# Patient Record
Sex: Male | Born: 1985 | Race: Black or African American | Hispanic: No | Marital: Married | Smoking: Former smoker
Health system: Southern US, Community
[De-identification: ages and names within clinical notes are randomized; demographics above are authoritative.]

## PROBLEM LIST (undated history)

## (undated) DIAGNOSIS — I10 Essential (primary) hypertension: Secondary | ICD-10-CM

---

## 2019-01-14 ENCOUNTER — Other Ambulatory Visit: Payer: Self-pay

## 2019-01-14 DIAGNOSIS — Z20822 Contact with and (suspected) exposure to covid-19: Secondary | ICD-10-CM

## 2019-01-16 LAB — NOVEL CORONAVIRUS, NAA: SARS-CoV-2, NAA: NOT DETECTED

## 2019-03-23 ENCOUNTER — Emergency Department (HOSPITAL_COMMUNITY)
Admission: EM | Admit: 2019-03-23 | Discharge: 2019-03-23 | Disposition: A | Discharge status: Home / Self Care | Payer: 59 | Attending: Emergency Medicine | Admitting: Emergency Medicine

## 2019-03-23 ENCOUNTER — Encounter (HOSPITAL_COMMUNITY): Payer: Self-pay

## 2019-03-23 ENCOUNTER — Other Ambulatory Visit: Payer: Self-pay

## 2019-03-23 ENCOUNTER — Emergency Department (HOSPITAL_COMMUNITY): Payer: 59

## 2019-03-23 DIAGNOSIS — Z87891 Personal history of nicotine dependence: Secondary | ICD-10-CM | POA: Diagnosis not present

## 2019-03-23 DIAGNOSIS — R059 Cough, unspecified: Secondary | ICD-10-CM

## 2019-03-23 DIAGNOSIS — J40 Bronchitis, not specified as acute or chronic: Secondary | ICD-10-CM | POA: Insufficient documentation

## 2019-03-23 DIAGNOSIS — R05 Cough: Secondary | ICD-10-CM

## 2019-03-23 MED ORDER — PREDNISONE 10 MG (21) PO TBPK: ORAL_TABLET | Freq: Every day | ORAL | 0 refills | Status: DC

## 2019-03-23 MED ORDER — ALBUTEROL SULFATE HFA 108 (90 BASE) MCG/ACT IN AERS
2.0000 | INHALATION_SPRAY | RESPIRATORY_TRACT | Status: DC
  Administered 2019-03-23: 13:00:00 2 via RESPIRATORY_TRACT
  Filled 2019-03-23: qty 6.7

## 2019-03-23 NOTE — ED Triage Notes (Signed)
Patient c/o a productive cough with green/yellow sputum x 2 weeks.

## 2019-03-23 NOTE — ED Provider Notes (Signed)
[facility] DEPT Provider Note   CSN: [id number] Arrival date & time: 03/23/19  1057     History   Chief Complaint Chief Complaint  Patient presents with  . Cough    HPI Luke Bush is a 33 y.o. male.     33 year old male with 2 weeks of nonproductive cough and wheezing.  Does have a history of similar symptoms in the past associate bronchitis.  Has not use any medications for this.  Does admit to marijuana use as well as occasional vaping.  Denies any severe dyspnea on exertion.  No sore throat or ear pain.  No vomiting or diarrhea.  Denies any Covid exposures.     History reviewed. No pertinent past medical history.  There are no active problems to display for this patient.   History reviewed. No pertinent surgical history.      Home Medications    Prior to Admission medications   Not on File    Family History Family History  Problem Relation Age of Onset  . Healthy Mother   . Healthy Father     Social History Social History   Tobacco Use  . Smoking status: Former Research scientist (life sciences)  . Smokeless tobacco: Never Used  Substance Use Topics  . Alcohol use: Yes  . Drug use: Yes    Types: Marijuana     Allergies   Patient has no known allergies.   Review of Systems Review of Systems  All other systems reviewed and are negative.    Physical Exam Updated Vital Signs BP (!) 140/101 (BP Location: Left Arm)   Pulse 92   Temp 98.8 F (37.1 C) (Oral)   Resp 16   Ht 1.854 m (6\' 1" )   Wt 89.8 kg   SpO2 97%   BMI 26.12 kg/m   Physical Exam Vitals signs and nursing note reviewed.  Constitutional:      General: He is not in acute distress.    Appearance: Normal appearance. He is well-developed. He is not toxic-appearing.  HENT:     Head: Normocephalic and atraumatic.  Eyes:     General: Lids are normal.     Conjunctiva/sclera: Conjunctivae normal.     Pupils: Pupils are equal, round, and reactive to light.  Neck:   Musculoskeletal: Normal range of motion and neck supple.     Thyroid: No thyroid mass.     Trachea: No tracheal deviation.  Cardiovascular:     Rate and Rhythm: Normal rate and regular rhythm.     Heart sounds: Normal heart sounds. No murmur. No gallop.   Pulmonary:     Effort: Pulmonary effort is normal. No respiratory distress.     Breath sounds: No stridor. Examination of the right-lower field reveals decreased breath sounds and wheezing. Examination of the left-lower field reveals decreased breath sounds and wheezing. Decreased breath sounds and wheezing present. No rhonchi or rales.  Abdominal:     General: Bowel sounds are normal. There is no distension.     Palpations: Abdomen is soft.     Tenderness: There is no abdominal tenderness. There is no rebound.  Musculoskeletal: Normal range of motion.        General: No tenderness.  Skin:    General: Skin is warm and dry.     Findings: No abrasion or rash.  Neurological:     Mental Status: He is alert and oriented to person, place, and time.     GCS: GCS eye subscore is 4. GCS  verbal subscore is 5. GCS motor subscore is 6.     Cranial Nerves: No cranial nerve deficit.     Sensory: No sensory deficit.  Psychiatric:        Speech: Speech normal.        Behavior: Behavior normal.      ED Treatments / Results  Labs (all labs ordered are listed, but only abnormal results are displayed) Labs Reviewed - No data to display  EKG None  Radiology No results found.  Procedures Procedures (including critical care time)  Medications Ordered in ED Medications - No data to display   Initial Impression / Assessment and Plan / ED Course  I have reviewed the triage vital signs and the nursing notes.  Pertinent labs & imaging results that were available during my care of the patient were reviewed by me and considered in my medical decision making (see chart for details).       No evidence of pneumonia on chest x-ray.  Patient  symptoms are similar to prior episodes of bronchitis.  Will prescribe prednisone as well as albuterol HFA  Final Clinical Impressions(s) / ED Diagnoses   Final diagnoses:  Cough    ED Discharge Orders    None       Lorre Nick, MD 03/23/19 1236

## 2020-01-27 ENCOUNTER — Ambulatory Visit: Admit: 2020-01-27 | Discharge status: Against Medical Advice | Payer: Self-pay

## 2020-01-27 ENCOUNTER — Other Ambulatory Visit: Payer: Self-pay

## 2020-01-27 ENCOUNTER — Ambulatory Visit
Admission: EM | Admit: 2020-01-27 | Discharge: 2020-01-27 | Disposition: A | Discharge status: Home / Self Care | Payer: 59 | Attending: Emergency Medicine | Admitting: Emergency Medicine

## 2020-01-27 DIAGNOSIS — Z20822 Contact with and (suspected) exposure to covid-19: Secondary | ICD-10-CM

## 2020-01-27 DIAGNOSIS — R030 Elevated blood-pressure reading, without diagnosis of hypertension: Secondary | ICD-10-CM | POA: Diagnosis not present

## 2020-01-27 DIAGNOSIS — R009 Unspecified abnormalities of heart beat: Secondary | ICD-10-CM

## 2020-01-27 DIAGNOSIS — J069 Acute upper respiratory infection, unspecified: Secondary | ICD-10-CM

## 2020-01-27 MED ORDER — CETIRIZINE HCL 10 MG PO TABS: 10.0000 mg | ORAL_TABLET | Freq: Every day | ORAL | 0 refills | Status: DC

## 2020-01-27 MED ORDER — FLUTICASONE PROPIONATE 50 MCG/ACT NA SUSP: 1.0000 | Freq: Every day | NASAL | 0 refills | Status: AC

## 2020-01-27 MED ORDER — BENZONATATE 100 MG PO CAPS: 100.0000 mg | ORAL_CAPSULE | Freq: Three times a day (TID) | ORAL | 0 refills | Status: AC

## 2020-01-27 NOTE — ED Provider Notes (Signed)
[facility]    CSN: [id number] Arrival date & time: 01/27/20  1724      History   Chief Complaint Chief Complaint  Patient presents with  . Otalgia  . Sore Throat    HPI Luke Bush is a 34 y.o. male  Presenting for btl otalgia, ST, emesis (x 4) for the last 5 days. Has had some loose stools as well.  Denies fever, cough, CP, SOB, nausea, abd pain, hematochezia, melena.  Endorses adequate oral intake.  History reviewed. No pertinent past medical history.  There are no problems to display for this patient.   History reviewed. No pertinent surgical history.     Home Medications    Prior to Admission medications   Medication Sig Start Date End Date Taking? Authorizing Provider  benzonatate (TESSALON) 100 MG capsule Take 1 capsule (100 mg total) by mouth every 8 (eight) hours. 01/27/20   Hall-Potvin, [place], PA-C  cetirizine (ZYRTEC ALLERGY) 10 MG tablet Take 1 tablet (10 mg total) by mouth daily. 01/27/20   Hall-Potvin, [place], PA-C  fluticasone (FLONASE) 50 MCG/ACT nasal spray Place 1 spray into both nostrils daily. 01/27/20   Hall-Potvin, [place], PA-C    Family History Family History  Problem Relation Age of Onset  . Healthy Mother   . Healthy Father     Social History Social History   Tobacco Use  . Smoking status: Former Games developer  . Smokeless tobacco: Never Used  Vaping Use  . Vaping Use: Some days  . Substances: Nicotine, Flavoring  Substance Use Topics  . Alcohol use: Yes  . Drug use: Yes    Types: Marijuana     Allergies   Patient has no known allergies.   Review of Systems As per HPI   Physical Exam Triage Vital Signs ED Triage Vitals  Enc Vitals Group     BP      Pulse      Resp      Temp      Temp src      SpO2      Weight      Height      Head Circumference      Peak Flow      Pain Score      Pain Loc      Pain Edu?      Excl. in GC?    No data found.  Updated Vital Signs BP (!) 154/109   Pulse 80    Temp 98.5 F (36.9 C)   Resp 19   SpO2 98%   Visual Acuity Right Eye Distance:   Left Eye Distance:   Bilateral Distance:    Right Eye Near:   Left Eye Near:    Bilateral Near:     Physical Exam Constitutional:      General: He is not in acute distress.    Appearance: He is not toxic-appearing or diaphoretic.  HENT:     Head: Normocephalic and atraumatic.     Right Ear: Tympanic membrane and ear canal normal.     Left Ear: Tympanic membrane and ear canal normal.     Mouth/Throat:     Mouth: Mucous membranes are moist.     Pharynx: Oropharynx is clear.  Eyes:     General: No scleral icterus.    Conjunctiva/sclera: Conjunctivae normal.     Pupils: Pupils are equal, round, and reactive to light.  Neck:     Comments: Trachea midline, negative JVD Cardiovascular:  Rate and Rhythm: Normal rate and regular rhythm.  Pulmonary:     Effort: Pulmonary effort is normal. No respiratory distress.     Breath sounds: No wheezing.  Musculoskeletal:     Cervical back: Neck supple. No tenderness.  Lymphadenopathy:     Cervical: No cervical adenopathy.  Skin:    Capillary Refill: Capillary refill takes less than 2 seconds.     Coloration: Skin is not jaundiced or pale.     Findings: No rash.  Neurological:     Mental Status: He is alert and oriented to person, place, and time.      UC Treatments / Results  Labs (all labs ordered are listed, but only abnormal results are displayed) Labs Reviewed  NOVEL CORONAVIRUS, NAA    EKG   Radiology No results found.  Procedures Procedures (including critical care time)  Medications Ordered in UC Medications - No data to display  Initial Impression / Assessment and Plan / UC Course  I have reviewed the triage vital signs and the nursing notes.  Pertinent labs & imaging results that were available during my care of the patient were reviewed by me and considered in my medical decision making (see chart for details).      Patient afebrile, nontoxic, with SpO2 98%.  Covid PCR pending.  Patient to quarantine until results are back.  We will treat supportively as outlined below.  Return precautions discussed, patient verbalized understanding and is agreeable to plan. Final Clinical Impressions(s) / UC Diagnoses   Final diagnoses:  Encounter for screening laboratory testing for COVID-19 virus  Elevated heart rate with elevated blood pressure without diagnosis of hypertension  Viral URI     Discharge Instructions     Your COVID test is pending - it is important to quarantine / isolate at home until your results are back. If you test positive and would like further evaluation for persistent or worsening symptoms, you may schedule an E-visit or virtual (video) visit throughout the [facility] app or website.  PLEASE NOTE: If you develop severe chest pain or shortness of breath please go to the ER or call 9-1-1 for further evaluation --> DO NOT schedule electronic or virtual visits for this. Please call our office for further guidance / recommendations as needed.  For information about the Covid vaccine, please visit [email or web address]    ED Prescriptions    Medication Sig Dispense Auth. Provider   cetirizine (ZYRTEC ALLERGY) 10 MG tablet Take 1 tablet (10 mg total) by mouth daily. 30 tablet Hall-Potvin, [place], PA-C   fluticasone (FLONASE) 50 MCG/ACT nasal spray Place 1 spray into both nostrils daily. 16 g Hall-Potvin, [place], PA-C   benzonatate (TESSALON) 100 MG capsule Take 1 capsule (100 mg total) by mouth every 8 (eight) hours. 21 capsule Hall-Potvin, [place], PA-C     PDMP not reviewed this encounter.   Hall-Potvin, [place], [place] 01/27/20 1831

## 2020-01-27 NOTE — ED Triage Notes (Signed)
Pt presents with complaints of bilateral ear pain, sore throat and emesis x 5 days. Reports he is concerned for sinus infection. Pt states he threw up 4 times and also had some diarrhea as well. Denies any other symptoms.

## 2020-01-27 NOTE — Discharge Instructions (Addendum)
Your COVID test is pending - it is important to quarantine / isolate at home until your results are back. °If you test positive and would like further evaluation for persistent or worsening symptoms, you may schedule an E-visit or virtual (video) visit throughout the [facility] My[facility] app or website. ° °PLEASE NOTE: If you develop severe chest pain or shortness of breath please go to the ER or call 9-1-1 for further evaluation --> DO NOT schedule electronic or virtual visits for this. °Please call our office for further guidance / recommendations as needed. ° °For information about the Covid vaccine, please visit [email or web address] °

## 2020-01-30 LAB — NOVEL CORONAVIRUS, NAA: SARS-CoV-2, NAA: NOT DETECTED

## 2020-06-27 ENCOUNTER — Encounter (HOSPITAL_COMMUNITY): Payer: Self-pay | Admitting: Emergency Medicine

## 2020-06-27 ENCOUNTER — Emergency Department (HOSPITAL_COMMUNITY): Payer: Medicaid - Out of State

## 2020-06-27 ENCOUNTER — Other Ambulatory Visit: Payer: Self-pay

## 2020-06-27 ENCOUNTER — Emergency Department (HOSPITAL_COMMUNITY)
Admission: EM | Admit: 2020-06-27 | Discharge: 2020-06-28 | Disposition: A | Discharge status: Home / Self Care | Payer: Medicaid - Out of State | Attending: Emergency Medicine | Admitting: Emergency Medicine

## 2020-06-27 DIAGNOSIS — Z87891 Personal history of nicotine dependence: Secondary | ICD-10-CM | POA: Insufficient documentation

## 2020-06-27 DIAGNOSIS — W010XXA Fall on same level from slipping, tripping and stumbling without subsequent striking against object, initial encounter: Secondary | ICD-10-CM | POA: Insufficient documentation

## 2020-06-27 DIAGNOSIS — S6991XA Unspecified injury of right wrist, hand and finger(s), initial encounter: Secondary | ICD-10-CM | POA: Diagnosis present

## 2020-06-27 DIAGNOSIS — S63501A Unspecified sprain of right wrist, initial encounter: Secondary | ICD-10-CM | POA: Diagnosis not present

## 2020-06-27 NOTE — ED Triage Notes (Signed)
Patient slipped and injured his right wrist this evening .

## 2020-06-28 NOTE — Progress Notes (Signed)
Orthopedic Tech Progress Note Patient Details:  Luke Bush 1986/02/24 [id number]  Ortho Devices Type of Ortho Device: Velcro wrist splint Ortho Device/Splint Location: rue Ortho Device/Splint Interventions: Ordered,Application,Adjustment   Post Interventions Patient Tolerated: Well Instructions Provided: Care of device,Adjustment of device   Trinna Post 06/28/2020, 1:14 AM

## 2020-06-28 NOTE — ED Provider Notes (Signed)
[facility] EMERGENCY DEPARTMENT Provider Note   CSN: [id number] Arrival date & time: 06/27/20  2123     History Chief Complaint  Patient presents with  . Wrist Injury    Luke Bush is a 35 y.o. male.  35 year old male presents to the emergency department for evaluation of right wrist pain.  Reports that he slipped and fell with his right hand outstretched to catch his fall.  Has progressively developed worsening pain in his right wrist.  Describes a sharp pain sensation as well as associated swelling.  This improved with icing prior to arrival.  He did take some Tylenol with minimal relief.  No history of prior wrist injury or fracture.  He is right-hand dominant.       History reviewed. No pertinent past medical history.  There are no problems to display for this patient.   History reviewed. No pertinent surgical history.     Family History  Problem Relation Age of Onset  . Healthy Mother   . Healthy Father     Social History   Tobacco Use  . Smoking status: Former Games developer  . Smokeless tobacco: Never Used  Vaping Use  . Vaping Use: Some days  . Substances: Nicotine, Flavoring  Substance Use Topics  . Alcohol use: Yes  . Drug use: Yes    Types: Marijuana    Home Medications Prior to Admission medications   Medication Sig Start Date End Date Taking? Authorizing Provider  benzonatate (TESSALON) 100 MG capsule Take 1 capsule (100 mg total) by mouth every 8 (eight) hours. 01/27/20   Hall-Potvin, [place], PA-C  cetirizine (ZYRTEC ALLERGY) 10 MG tablet Take 1 tablet (10 mg total) by mouth daily. 01/27/20   Hall-Potvin, [place], PA-C  fluticasone (FLONASE) 50 MCG/ACT nasal spray Place 1 spray into both nostrils daily. 01/27/20   Hall-Potvin, [place], PA-C    Allergies    Patient has no known allergies.  Review of Systems   Review of Systems  Ten systems reviewed and are negative for acute change, except as noted in the HPI.    Physical  Exam Updated Vital Signs BP (!) 153/96   Pulse 89   Temp 98 F (36.7 C)   Resp 20   SpO2 99%   Physical Exam Vitals and nursing note reviewed.  Constitutional:      General: He is not in acute distress.    Appearance: He is well-developed and well-nourished. He is not diaphoretic.     Comments: Nontoxic-appearing and in no acute distress  HENT:     Head: Normocephalic and atraumatic.  Eyes:     General: No scleral icterus.    Extraocular Movements: EOM normal.     Conjunctiva/sclera: Conjunctivae normal.  Cardiovascular:     Rate and Rhythm: Normal rate and regular rhythm.     Pulses: Normal pulses.     Comments: Distal radial pulse 2+ in the right upper extremity.  Capillary refill brisk in all digits of the right hand. Pulmonary:     Effort: Pulmonary effort is normal. No respiratory distress.     Comments: Respirations even and unlabored Musculoskeletal:        General: Normal range of motion.     Cervical back: Normal range of motion.     Comments: Mild tenderness along the ulnar aspect of the right hand as well as just proximal to the ulnar styloid.  There is no crepitus, deformity.  No palpable effusion.  Preserved grip strength in the right  hand with normal range of motion of all digits.  Skin:    General: Skin is warm and dry.     Coloration: Skin is not pale.     Findings: No erythema or rash.  Neurological:     Mental Status: He is alert and oriented to person, place, and time.     Coordination: Coordination normal.     Comments: Finger to thumb opposition intact in the R hand.  Psychiatric:        Mood and Affect: Mood and affect normal.        Behavior: Behavior normal.     ED Results / Procedures / Treatments   Labs (all labs ordered are listed, but only abnormal results are displayed) Labs Reviewed - No data to display  EKG None  Radiology DG Wrist Complete Right  Result Date: 06/27/2020 CLINICAL DATA:  Right wrist pain after injury. EXAM: RIGHT  WRIST - COMPLETE 3+ VIEW COMPARISON:  None. FINDINGS: There is no evidence of fracture or dislocation. Normal joint spaces and alignment. There is soft tissue edema about the ulnar aspect of the wrist. IMPRESSION: Soft tissue edema.  No fracture. Electronically Signed   By: Narda Rutherford M.D.   On: 06/27/2020 22:05    Procedures Procedures   Medications Ordered in ED Medications - No data to display  ED Course  I have reviewed the triage vital signs and the nursing notes.  Pertinent labs & imaging results that were available during my care of the patient were reviewed by me and considered in my medical decision making (see chart for details).    MDM Rules/Calculators/A&P                          Patient presents to the emergency department for evaluation of R wrist pain. Patient neurovascularly intact on exam. Imaging negative for fracture, dislocation, bony deformity. No erythema, heat to touch to the affected area; no concern for septic joint. Compartments in the affected extremity are soft. Plan for supportive management including RICE and NSAIDs; primary care follow up as needed. Return precautions discussed and provided. Patient discharged in stable condition with no unaddressed concerns.   Final Clinical Impression(s) / ED Diagnoses Final diagnoses:  Sprain of right wrist, initial encounter    Rx / DC Orders ED Discharge Orders    None       Antony Madura, PA-C 06/28/20 0026    Cheryll Cockayne, MD 06/28/20 [phone]

## 2020-06-28 NOTE — Discharge Instructions (Signed)
Your x-ray did not show evidence of fracture/broken bone.  We recommend bracing for stability as well as ibuprofen or Aleve for management of pain and inflammation.  Ice your wrist 3-4 times per day to limit swelling as well.  You may return for new or concerning symptoms.

## 2020-10-18 ENCOUNTER — Ambulatory Visit
Admission: EM | Admit: 2020-10-18 | Discharge: 2020-10-18 | Disposition: A | Discharge status: Home / Self Care | Payer: Commercial Managed Care - PPO | Attending: Family Medicine | Admitting: Family Medicine

## 2020-10-18 ENCOUNTER — Ambulatory Visit: Payer: Self-pay

## 2020-10-18 ENCOUNTER — Other Ambulatory Visit: Payer: Self-pay

## 2020-10-18 DIAGNOSIS — B349 Viral infection, unspecified: Secondary | ICD-10-CM

## 2020-10-18 DIAGNOSIS — Z1152 Encounter for screening for COVID-19: Secondary | ICD-10-CM

## 2020-10-18 MED ORDER — LEVOCETIRIZINE DIHYDROCHLORIDE 5 MG PO TABS: 5.0000 mg | ORAL_TABLET | Freq: Every evening | ORAL | 0 refills | Status: AC

## 2020-10-18 MED ORDER — PROMETHAZINE-DM 6.25-15 MG/5ML PO SYRP: 5.0000 mL | ORAL_SOLUTION | Freq: Four times a day (QID) | ORAL | 0 refills | Status: AC | PRN

## 2020-10-18 NOTE — Discharge Instructions (Addendum)
Your COVID 19 results should result within 3-4 days. Negative results are immediately resulted to Mychart. Positive results will receive a follow-up call from our clinic. If symptoms are present, I recommend home quarantine until results are known.  Alternate Tylenol and ibuprofen as needed for body aches and fever.  Symptom management per recommendations discussed today.  If any breathing difficulty or chest pain develops go immediately to the closest emergency department for evaluation.  

## 2020-10-18 NOTE — ED Triage Notes (Signed)
Pt presents with complaints of headache, body aches, sore throat, and fatigue x 3 days. Reports negative at home covid test. Concerned for flu and covid. Pt states over the last few days he has moments where he feels like his heart is beating fast.

## 2020-10-19 LAB — COVID-19, FLU A+B NAA
Influenza A, NAA: NOT DETECTED
Influenza B, NAA: NOT DETECTED
SARS-CoV-2, NAA: NOT DETECTED

## 2022-01-12 ENCOUNTER — Ambulatory Visit (INDEPENDENT_AMBULATORY_CARE_PROVIDER_SITE_OTHER): Payer: Self-pay

## 2022-01-12 ENCOUNTER — Ambulatory Visit
Admission: EM | Admit: 2022-01-12 | Discharge: 2022-01-12 | Disposition: A | Discharge status: Home / Self Care | Payer: Commercial Managed Care - PPO | Attending: Urgent Care | Admitting: Urgent Care

## 2022-01-12 DIAGNOSIS — M79641 Pain in right hand: Secondary | ICD-10-CM

## 2022-01-12 DIAGNOSIS — I10 Essential (primary) hypertension: Secondary | ICD-10-CM

## 2022-01-12 DIAGNOSIS — S66911A Strain of unspecified muscle, fascia and tendon at wrist and hand level, right hand, initial encounter: Secondary | ICD-10-CM

## 2022-01-12 DIAGNOSIS — R0789 Other chest pain: Secondary | ICD-10-CM

## 2022-01-12 MED ORDER — NAPROXEN 375 MG PO TABS: 375.0000 mg | ORAL_TABLET | Freq: Two times a day (BID) | ORAL | 0 refills | Status: AC

## 2022-01-12 MED ORDER — AMLODIPINE BESYLATE 5 MG PO TABS: 5.0000 mg | ORAL_TABLET | Freq: Every day | ORAL | 0 refills | Status: DC

## 2022-01-12 NOTE — ED Triage Notes (Signed)
Pt reports waking up overnight with "heart pain" going through to back.  Denies dizziness, SOB.    Triage completed by provider.

## 2022-01-12 NOTE — Discharge Instructions (Addendum)

## 2022-01-12 NOTE — ED Provider Notes (Signed)
[facility]  Note:  This document was prepared using Conservation officer, historic buildings and may include unintentional dictation errors.  MRN: [id number] DOB: 26-Jun-1985  Subjective:   Luke Bush is a 36 y.o. male presenting for s fall, history of gout, bruising, swelling, warmth, wounds.  Everal day history of persistent right-handed pain.  No trauma, patient does a lot of work at [employer] and has to use his hands and wrists consistently.  Has not use any medications for relief.  He is also concerned about his heart as he has been waking up overnight feeling like his heart is pounding through his chest and sometimes feels that toward his back.  No overt chest pain, shortness of breath, wheezing, palpitations, diaphoresis, left-sided neck pain, left-sided arm pain, left jaw pain.  Patient is a former smoker, quit smoking 2 months ago.  He is currently vaping occasionally and also smokes marijuana regularly.  Has a history of hypertension but states that he is not wanting to manage this with medication but has been using apple cider vinegar.  Does not have a PCP.  No history of heart disease.  No current facility-administered medications for this encounter.  Current Outpatient Medications:    benzonatate (TESSALON) 100 MG capsule, Take 1 capsule (100 mg total) by mouth every 8 (eight) hours., Disp: 21 capsule, Rfl: 0   fluticasone (FLONASE) 50 MCG/ACT nasal spray, Place 1 spray into both nostrils daily., Disp: 16 g, Rfl: 0   levocetirizine (XYZAL) 5 MG tablet, Take 1 tablet (5 mg total) by mouth every evening., Disp: 20 tablet, Rfl: 0   promethazine-dextromethorphan (PROMETHAZINE-DM) 6.25-15 MG/5ML syrup, Take 5 mLs by mouth 4 (four) times daily as needed for cough., Disp: 140 mL, Rfl: 0   No Known Allergies  No past medical history on file.   No past surgical history on file.  Family History  Problem Relation Age of Onset   Healthy Mother    Healthy  Father     Social History   Tobacco Use   Smoking status: Former   Smokeless tobacco: Never  Building services engineer Use: Some days   Substances: Nicotine, Flavoring  Substance Use Topics   Alcohol use: Yes   Drug use: Yes    Types: Marijuana    ROS   Objective:   Vitals: BP (!) 150/101 (BP Location: Right Arm)   Pulse 67   Temp 98.7 F (37.1 C) (Oral)   Resp 16   SpO2 98%   BP Readings from Last 3 Encounters:  01/12/22 (!) 150/101  10/18/20 135/88  06/28/20 (!) 149/94     Physical Exam Constitutional:      General: He is not in acute distress.    Appearance: Normal appearance. He is well-developed. He is not ill-appearing, toxic-appearing or diaphoretic.  HENT:     Head: Normocephalic and atraumatic.     Right Ear: External ear normal.     Left Ear: External ear normal.     Nose: Nose normal.     Mouth/Throat:     Mouth: Mucous membranes are moist.  Eyes:     General: No scleral icterus.       Right eye: No discharge.        Left eye: No discharge.     Extraocular Movements: Extraocular movements intact.  Cardiovascular:     Rate and Rhythm: Normal rate and regular rhythm.     Heart sounds: Normal heart sounds. No murmur  heard.    No friction rub. No gallop.  Pulmonary:     Effort: Pulmonary effort is normal. No respiratory distress.     Breath sounds: Normal breath sounds. No stridor. No wheezing, rhonchi or rales.  Musculoskeletal:       Hands:  Neurological:     Mental Status: He is alert and oriented to person, place, and time.     Cranial Nerves: No cranial nerve deficit.     Motor: No weakness.     Coordination: Coordination normal.     Gait: Gait normal.  Psychiatric:        Mood and Affect: Mood normal.        Behavior: Behavior normal.        Thought Content: Thought content normal.        Judgment: Judgment normal.     DG Hand Complete Right  Result Date: 01/12/2022 CLINICAL DATA:  Right hand pain and swelling for 1 day, initial  encounter EXAM: RIGHT HAND - COMPLETE 3+ VIEW COMPARISON:  None Available. FINDINGS: There is no evidence of fracture or dislocation. There is no evidence of arthropathy or other focal bone abnormality. Soft tissues are unremarkable. IMPRESSION: No acute abnormality noted. Electronically Signed   By: Inez Catalina M.D.   On: 01/12/2022 19:33    ED ECG REPORT   Date: 01/12/2022  EKG Time: 7:58 PM  Rate: 66bpm  Rhythm: normal sinus rhythm,  there are no previous tracings available for comparison  Axis: normal  Intervals:none  ST&T Change: none  Narrative Interpretation: Sinus rhythm at 66 bpm without acute findings. Voltage criteria for LVH.  No previous EKG available for comparison.   Assessment and Plan :   PDMP not reviewed this encounter.  1. Atypical chest pain   2. Right hand pain   3. Essential hypertension   4. Hand strain, right, initial encounter    Recommended patient start amlodipine for his hypertension especially given the voltage criteria for LVH and his current heart symptoms.  Recommended hypertensive friendly diet.  Establish care with a new PCP for follow-up and further management.  No signs of an acute encephalopathy or ACS.  Will defer ER visit as such.  Regarding his hand, recommended naproxen for inflammatory pain likely related to the nature of his work and excessive use of his hands. Counseled patient on potential for adverse effects with medications prescribed/recommended today, ER and return-to-clinic precautions discussed, patient verbalized understanding.    Jaynee Eagles, [place] 01/13/22 [phone]

## 2022-09-14 IMAGING — CR DG WRIST COMPLETE 3+V*R*
4 series · 4 of 4 positions shown · non-contrast
Comparison: None.

CLINICAL DATA: Right wrist pain after injury.

EXAM:
RIGHT WRIST - COMPLETE 3+ VIEW

[wrist pa]
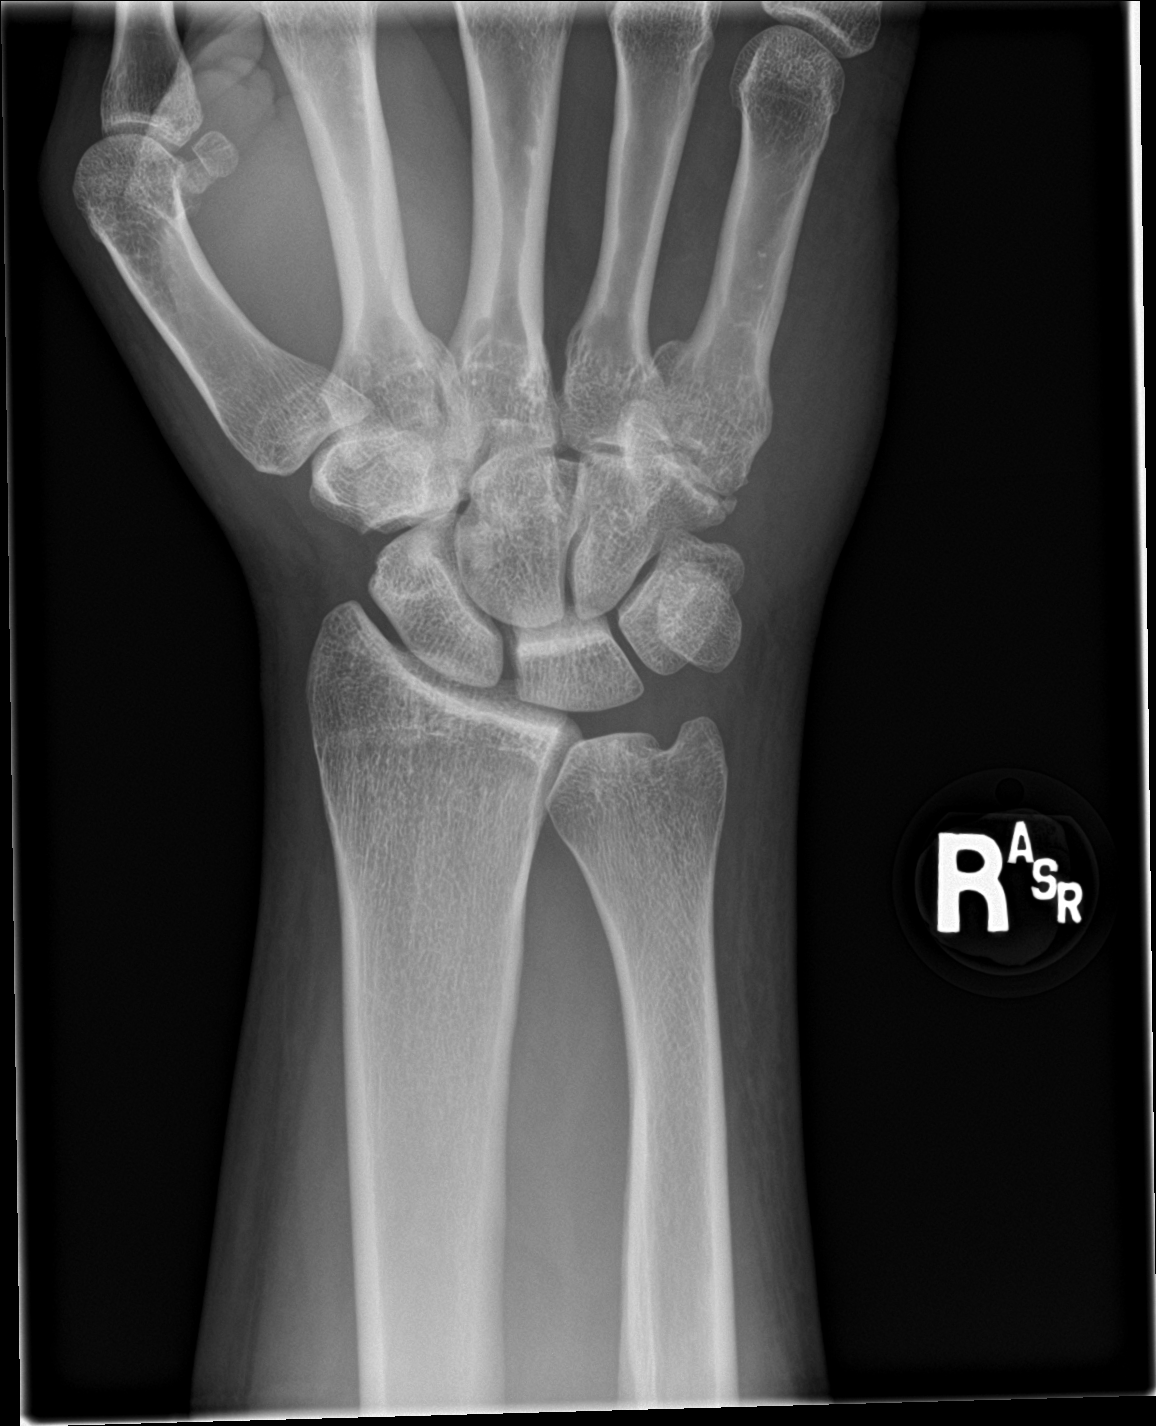

[wrist obl]
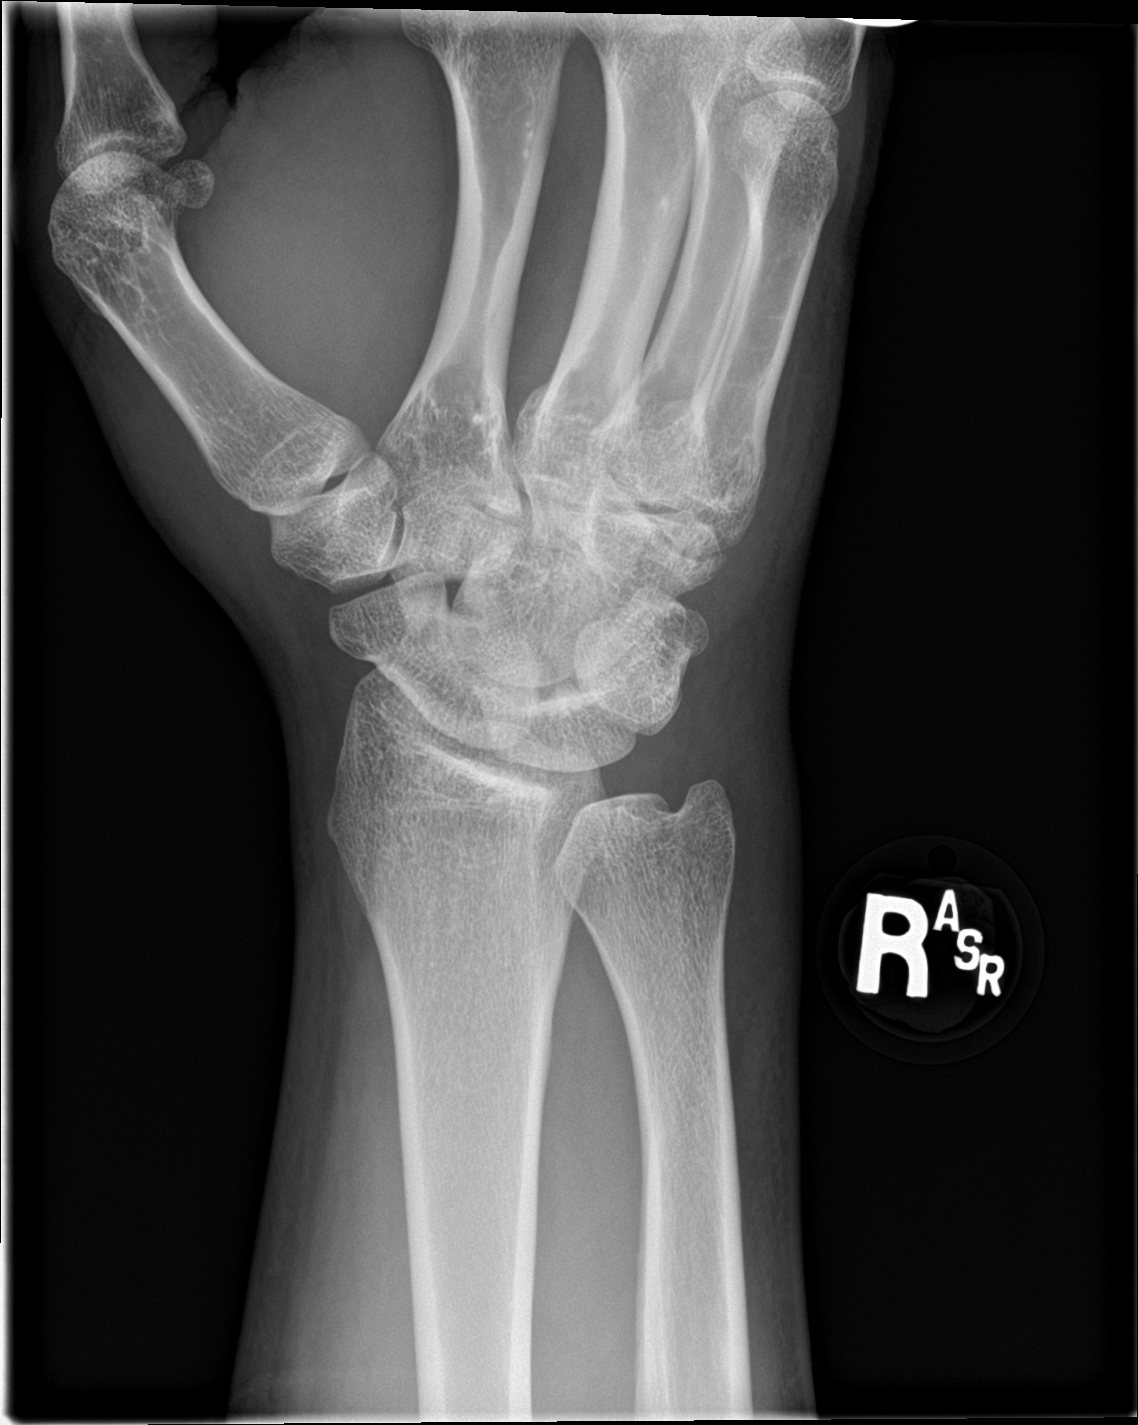

[wrist lat]
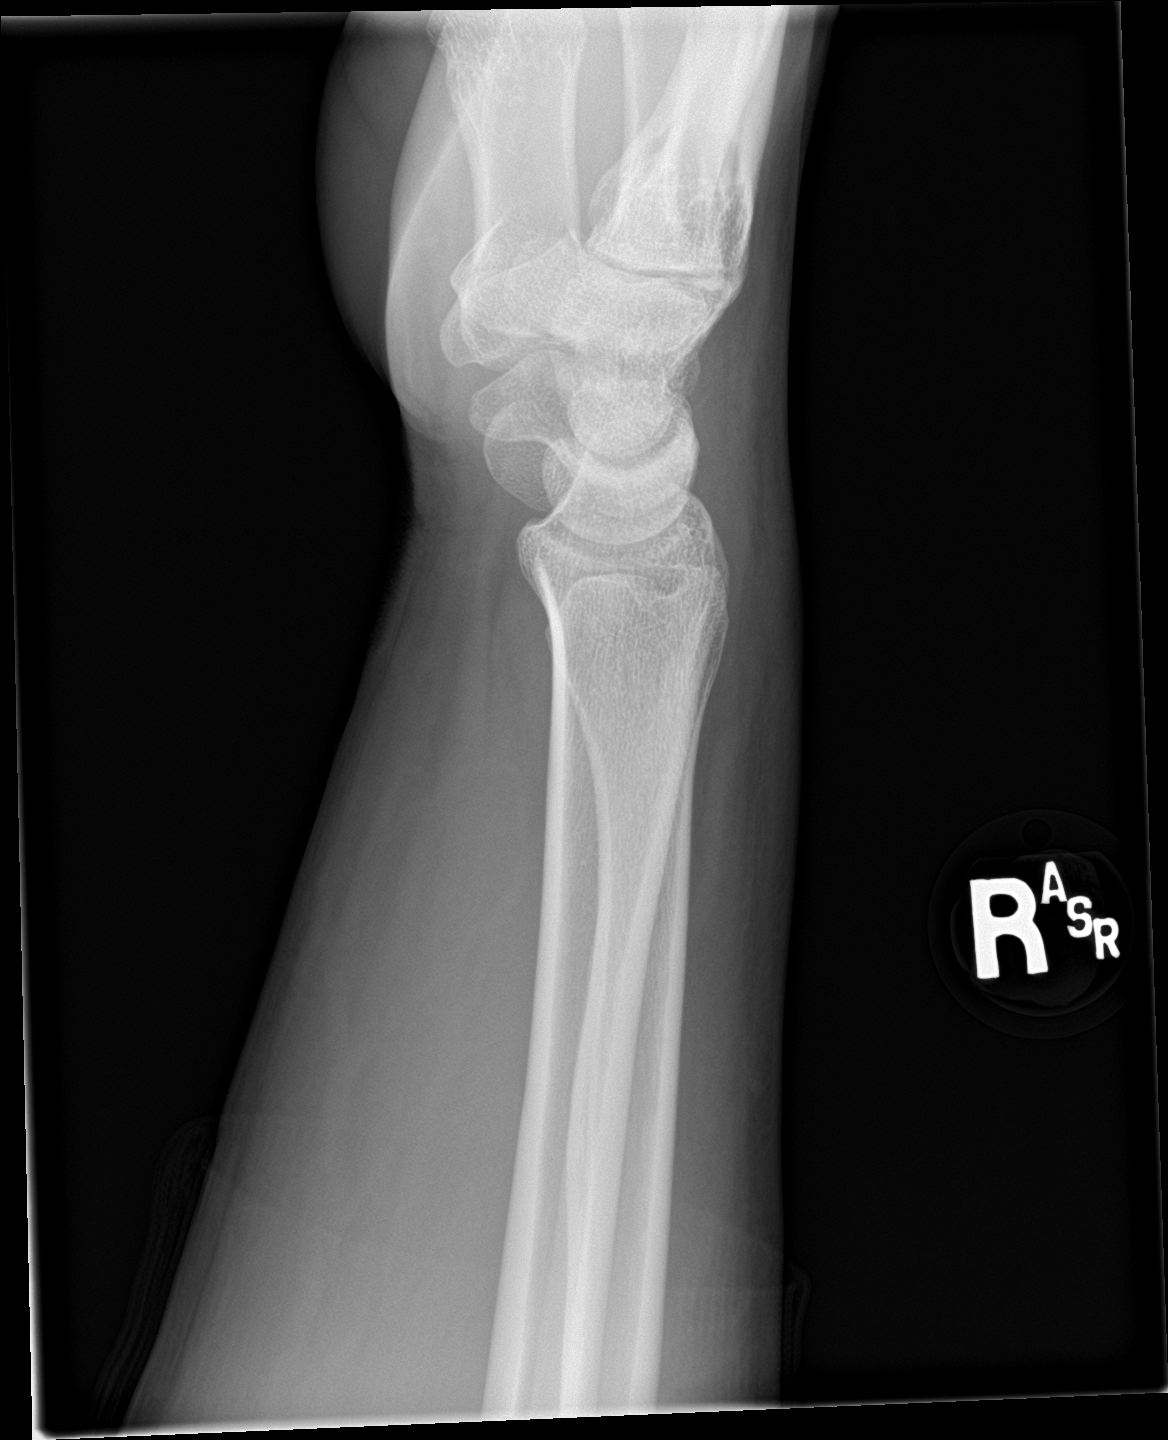

[wrist navicular]
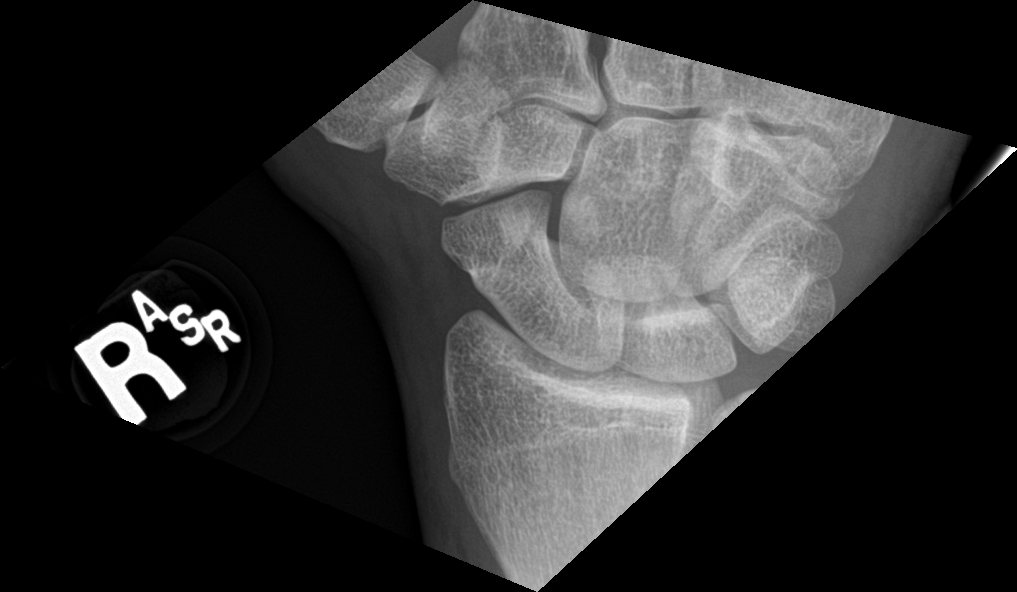

[4 of 4 positions shown; findings below may reference images not displayed]

FINDINGS: There is no evidence of fracture or dislocation. Normal joint spaces
and alignment. There is soft tissue edema about the ulnar aspect of
the wrist.
IMPRESSION: Soft tissue edema.  No fracture.

## 2024-01-02 ENCOUNTER — Encounter (HOSPITAL_COMMUNITY): Payer: Self-pay | Admitting: Emergency Medicine

## 2024-01-02 ENCOUNTER — Emergency Department (HOSPITAL_COMMUNITY)
Admission: EM | Admit: 2024-01-02 | Discharge: 2024-01-02 | Disposition: A | Discharge status: Home / Self Care | Payer: Self-pay | Attending: Emergency Medicine | Admitting: Emergency Medicine

## 2024-01-02 ENCOUNTER — Other Ambulatory Visit: Payer: Self-pay

## 2024-01-02 DIAGNOSIS — K047 Periapical abscess without sinus: Secondary | ICD-10-CM | POA: Insufficient documentation

## 2024-01-02 DIAGNOSIS — I10 Essential (primary) hypertension: Secondary | ICD-10-CM | POA: Insufficient documentation

## 2024-01-02 DIAGNOSIS — Z79899 Other long term (current) drug therapy: Secondary | ICD-10-CM | POA: Insufficient documentation

## 2024-01-02 HISTORY — DX: Essential (primary) hypertension: I10

## 2024-01-02 MED ORDER — KETOROLAC TROMETHAMINE 30 MG/ML IJ SOLN
30.0000 mg | Freq: Once | INTRAMUSCULAR | Status: AC
  Administered 2024-01-02: 30 mg via INTRAMUSCULAR
  Filled 2024-01-02: qty 1

## 2024-01-02 MED ORDER — OXYCODONE-ACETAMINOPHEN 5-325 MG PO TABS: 1.0000 | ORAL_TABLET | Freq: Four times a day (QID) | ORAL | 0 refills | Status: AC | PRN

## 2024-01-02 MED ORDER — AMOXICILLIN-POT CLAVULANATE 875-125 MG PO TABS: 1.0000 | ORAL_TABLET | Freq: Two times a day (BID) | ORAL | 0 refills | Status: DC

## 2024-01-02 MED ORDER — AMOXICILLIN-POT CLAVULANATE 875-125 MG PO TABS
1.0000 | ORAL_TABLET | Freq: Once | ORAL | Status: AC
  Administered 2024-01-02: 1 via ORAL
  Filled 2024-01-02: qty 1

## 2024-01-02 NOTE — ED Triage Notes (Signed)
 Pt presents to the ED via POV with complaints of LL dental pain x several weeks. He notes trying Tylenol  which has made the pain worse. Rates the pain 8/10. Of note, pt endorses hx of HTN (not on meds) and believes the pain from his tooth is causing fluctuations in his BP. A&Ox4 at this time. Denies CP or SOB.

## 2024-01-02 NOTE — ED Notes (Signed)
 Patient d/c wit home care instructions.

## 2024-01-02 NOTE — Discharge Instructions (Addendum)
 You can do ice and warm compresses to the outside of your face where the swelling is.  Take your next dose of antibiotics tomorrow morning.  You can use the pain medication as needed but it is okay to also use ibuprofen  in addition to the pain medication.  If you start having any swelling underneath your tongue or any trouble swallowing you should return to the emergency room.  Also go back to eating right and exercising but if your blood pressure remains elevated you need to follow-up with your regular doctor.

## 2024-01-02 NOTE — ED Provider Notes (Signed)
 [place] EMERGENCY DEPARTMENT AT [facility] Provider Note   CSN: [id number] Arrival date & time: 01/02/24  2129     Patient presents with: Dental Pain   Luke Bush is a 38 y.o. male.   Patient is a 38 year old male with a history of hypertension who is presenting today with complaints of dental pain.  He reports this tooth has been bothering him for about a month and a half and initially was pain and then part of it broke off and then in the last few days he started noticing worsening pain in the left lower tooth and some swelling in the side of his face that started yesterday.  The pain radiates into his left ear.  No fever, no voice changes or difficulty swallowing.  He reports about a month ago when this initially occurred his mom had some leftover amoxicillin  that he took but he has not taken any antibiotics recently.  He does not have any dental insurance.  The history is provided by the patient.  Dental Pain      Prior to Admission medications   Medication Sig Start Date End Date Taking? Authorizing Provider  amoxicillin -clavulanate (AUGMENTIN ) 875-125 MG tablet Take 1 tablet by mouth every 12 (twelve) hours. 01/02/24  Yes Doretha Folks, MD  oxyCODONE -acetaminophen  (PERCOCET/ROXICET) 5-325 MG tablet Take 1 tablet by mouth every 6 (six) hours as needed for severe pain (pain score 7-10). 01/02/24  Yes Anderia Lorenzo, Folks, MD  amLODipine  (NORVASC ) 5 MG tablet Take 1 tablet (5 mg total) by mouth daily. 01/12/22   Christopher Savannah, PA-C  benzonatate  (TESSALON ) 100 MG capsule Take 1 capsule (100 mg total) by mouth every 8 (eight) hours. 01/27/20   Hall-Potvin, [place], PA-C  fluticasone  (FLONASE ) 50 MCG/ACT nasal spray Place 1 spray into both nostrils daily. 01/27/20   Hall-Potvin, [place], PA-C  levocetirizine (XYZAL ) 5 MG tablet Take 1 tablet (5 mg total) by mouth every evening. 10/18/20   Arloa Suzen RAMAN, NP  naproxen  (NAPROSYN ) 375 MG tablet Take 1 tablet (375 mg total)  by mouth 2 (two) times daily with a meal. 01/12/22   Christopher Savannah, PA-C  promethazine -dextromethorphan (PROMETHAZINE -DM) 6.25-15 MG/5ML syrup Take 5 mLs by mouth 4 (four) times daily as needed for cough. 10/18/20   Arloa Suzen RAMAN, NP    Allergies: Patient has no known allergies.    Review of Systems  Updated Vital Signs BP (!) 157/107 (BP Location: Left Arm)   Pulse 74   Temp 98.3 F (36.8 C) (Oral)   Resp 20   Ht 6' 1 (1.854 m)   Wt 89.9 kg   SpO2 95%   BMI 26.15 kg/m   Physical Exam Vitals and nursing note reviewed.  Constitutional:      General: He is not in acute distress.    Appearance: He is well-developed.  HENT:     Head: Normocephalic and atraumatic.      Right Ear: Tympanic membrane normal.     Left Ear: Tympanic membrane normal.     Mouth/Throat:   Eyes:     Conjunctiva/sclera: Conjunctivae normal.     Pupils: Pupils are equal, round, and reactive to light.  Cardiovascular:     Rate and Rhythm: Normal rate.  Musculoskeletal:        General: No tenderness. Normal range of motion.     Cervical back: Normal range of motion and neck supple.  Skin:    General: Skin is warm and dry.     Findings: No erythema  or rash.  Neurological:     Mental Status: He is alert and oriented to person, place, and time. Mental status is at baseline.  Psychiatric:        Behavior: Behavior normal.     (all labs ordered are listed, but only abnormal results are displayed) Labs Reviewed - No data to display  EKG: None  Radiology: No results found.   Procedures   Medications Ordered in the ED  ketorolac  (TORADOL ) 30 MG/ML injection 30 mg (has no administration in time range)  amoxicillin -clavulanate (AUGMENTIN ) 875-125 MG per tablet 1 tablet (has no administration in time range)                                    Medical Decision Making Risk Prescription drug management.   Pt with dental caries and facial swelling.  No signs of ludwig's angina or difficulty  swallowing and no systemic symptoms. Will treat with augmentin  and have pt f/u with dentist. Patient was noted to be hypertensive here and he reports a history of hypertension which had improved when he was eating well and exercising which he reports he is kind of gotten away from.  He encouraged him to go back to that diet and exercise and if his blood pressure remained elevated he will need to follow-up with the PCP.     Final diagnoses:  Dental abscess    ED Discharge Orders          Ordered    amoxicillin -clavulanate (AUGMENTIN ) 875-125 MG tablet  Every 12 hours        01/02/24 2200    oxyCODONE -acetaminophen  (PERCOCET/ROXICET) 5-325 MG tablet  Every 6 hours PRN        01/02/24 2200               Doretha Folks, MD 01/02/24 2207

## 2024-01-05 ENCOUNTER — Emergency Department (HOSPITAL_COMMUNITY)
Admission: EM | Admit: 2024-01-05 | Discharge: 2024-01-05 | Disposition: A | Discharge status: Home / Self Care | Payer: Self-pay | Attending: Emergency Medicine | Admitting: Emergency Medicine

## 2024-01-05 DIAGNOSIS — Z79899 Other long term (current) drug therapy: Secondary | ICD-10-CM | POA: Insufficient documentation

## 2024-01-05 DIAGNOSIS — K029 Dental caries, unspecified: Secondary | ICD-10-CM | POA: Insufficient documentation

## 2024-01-05 DIAGNOSIS — K0889 Other specified disorders of teeth and supporting structures: Secondary | ICD-10-CM

## 2024-01-05 MED ORDER — GABAPENTIN 100 MG PO CAPS: 100.0000 mg | ORAL_CAPSULE | Freq: Three times a day (TID) | ORAL | 0 refills | Status: AC

## 2024-01-05 MED ORDER — KETOROLAC TROMETHAMINE 15 MG/ML IJ SOLN
15.0000 mg | Freq: Once | INTRAMUSCULAR | Status: AC
  Administered 2024-01-05: 15 mg via INTRAMUSCULAR
  Filled 2024-01-05: qty 1

## 2024-01-05 MED ORDER — IBUPROFEN 600 MG PO TABS: 600.0000 mg | ORAL_TABLET | Freq: Four times a day (QID) | ORAL | 0 refills | Status: AC | PRN

## 2024-01-05 NOTE — ED Triage Notes (Signed)
 Pt states that he was recently seen for L lower dental pain and prescribed a medication for pain which he states is not helping. Pt denies injury, no recent fevers.

## 2024-01-05 NOTE — ED Provider Notes (Signed)
 [place] EMERGENCY DEPARTMENT AT [facility] Provider Note   CSN: [id number] Arrival date & time: 01/05/24  1505     Patient presents with: Dental Pain   Luke Bush is a 38 y.o. male presents to the ER today for evaluation of dental pain for the past few days. The tooth has not bothered him for a month and a half and has been broken.  Was seen at the ER a few days prior and was given Augmentin  and narcotic pain medication.  He reports that it has not improved his symptoms.  He is requesting gabapentin .  He did obtain a dentist appointment on Thursday.  Denies any fevers.  Denies any difficulty swallowing or trouble breathing.  He denies any worsening of condition from when he was seen in the ER previously.   Dental Pain Associated symptoms: no congestion, no facial swelling, no fever and no neck pain        Prior to Admission medications   Medication Sig Start Date End Date Taking? Authorizing Provider  amLODipine  (NORVASC ) 5 MG tablet Take 1 tablet (5 mg total) by mouth daily. 01/12/22   Christopher Savannah, PA-C  amoxicillin -clavulanate (AUGMENTIN ) 875-125 MG tablet Take 1 tablet by mouth every 12 (twelve) hours. 01/02/24   Doretha Folks, MD  benzonatate  (TESSALON ) 100 MG capsule Take 1 capsule (100 mg total) by mouth every 8 (eight) hours. 01/27/20   Hall-Potvin, [place], PA-C  fluticasone  (FLONASE ) 50 MCG/ACT nasal spray Place 1 spray into both nostrils daily. 01/27/20   Hall-Potvin, [place], PA-C  levocetirizine (XYZAL ) 5 MG tablet Take 1 tablet (5 mg total) by mouth every evening. 10/18/20   Arloa Suzen RAMAN, NP  naproxen  (NAPROSYN ) 375 MG tablet Take 1 tablet (375 mg total) by mouth 2 (two) times daily with a meal. 01/12/22   Christopher Savannah, PA-C  oxyCODONE -acetaminophen  (PERCOCET/ROXICET) 5-325 MG tablet Take 1 tablet by mouth every 6 (six) hours as needed for severe pain (pain score 7-10). 01/02/24   Doretha Folks, MD  promethazine -dextromethorphan (PROMETHAZINE -DM)  6.25-15 MG/5ML syrup Take 5 mLs by mouth 4 (four) times daily as needed for cough. 10/18/20   Arloa Suzen RAMAN, NP    Allergies: Patient has no known allergies.    Review of Systems  Constitutional:  Negative for chills and fever.  HENT:  Positive for dental problem. Negative for congestion, facial swelling, rhinorrhea and trouble swallowing.   Respiratory:  Negative for shortness of breath.   Cardiovascular:  Negative for chest pain.  Musculoskeletal:  Negative for neck pain and neck stiffness.    Updated Vital Signs BP (!) 168/117 (BP Location: Left Arm)   Pulse 78   Temp 98.2 F (36.8 C) (Oral)   Resp 14   SpO2 100%   Physical Exam Vitals and nursing note reviewed.  Constitutional:      General: He is not in acute distress.    Appearance: He is not toxic-appearing.  HENT:     Head:     Comments: No swelling noted    Mouth/Throat:     Mouth: Mucous membranes are moist.     Comments: Tenderness and broken tooth to the lower left molar.  He has no erythema to the gums.  No induration or fluctuance.  No sublingual elevation.  Moist mucous membranes.  Controlling secretions.  Normal phonation.  No trismus.  Multiple other dental caries present. Eyes:     General: No scleral icterus. Neck:     Comments: No swelling noted Pulmonary:  Effort: Pulmonary effort is normal. No respiratory distress.     Breath sounds: No stridor.  Lymphadenopathy:     Cervical: No cervical adenopathy.  Skin:    General: Skin is warm and dry.  Neurological:     Mental Status: He is alert.     (all labs ordered are listed, but only abnormal results are displayed) Labs Reviewed - No data to display  EKG: None  Radiology: No results found.  Procedures   Medications Ordered in the ED - No data to display                              Medical Decision Making Risk Prescription drug management.   38 y.o. male presents to the ER today for evaluation of dental pain. Differential  diagnosis includes but is not limited to dental pain, dental caries, abscess, ludwig's angina . Vital signs elevated BP, otherwise unremarkable. Does appear consistent with his previous. Physical exam as noted above.   On previous chart evaluation, he was seen on 9 - 10 - 25 who was given antibiotic and narcotic pain medication.  He does not follow the narcotic pain medication is helping and is still having pain.  Denies any fever or worsening of his pain.  He is requesting gabapentin .  I have offered him a nerve block however he declines. I discussed gabapentin  dosing with pharmacy. Discussed case with my attending on prescribing gabapentin  for dental pain, and he agrees. I will give him enough to last him till his appointment on Thursday. He does not have any obvious facial swelling that was mentioned in previous charts.  There is no palpable induration or fluctuance.  No subungual elevation.  Do not think he needs additional lab work or emergent imaging.  Recommended that he attend his scheduled dentist appointment later this week.  Recommended still continue with his antibiotics.  Stable for discharge home  We discussed plan at bedside. We discussed strict return precautions and red flag symptoms. The patient verbalized their understanding and agrees to the plan. The patient is stable and being discharged home in good condition.  Portions of this report may have been transcribed using voice recognition software. Every effort was made to ensure accuracy; however, inadvertent computerized transcription errors may be present.   I discussed this case with my attending physician who cosigned this note including patient's presenting symptoms, physical exam, and planned diagnostics and interventions. Attending physician stated agreement with plan or made changes to plan which were implemented.    Final diagnoses:  Pain, dental    ED Discharge Orders          Ordered    gabapentin  (NEURONTIN ) 100 MG  capsule  3 times daily        01/05/24 1643    ibuprofen  (ADVIL ) 600 MG tablet  Every 6 hours PRN        01/05/24 1643               [name], PA-C 01/06/24 2058    [name], MD 01/07/24 1445

## 2024-01-05 NOTE — Discharge Instructions (Addendum)
 You were seen in the ER today for evaluation of your dental pain.  Please make sure you keep your dentist appointment on Thursday.  You were given pain medication here, so you can take ibuprofen  and 8 hours.  For pain, I do recommend taking an 1000mg  of Tylenol  with 600 mg of ibuprofen  every 6 hours as needed for pain.  Do not take your Percocet with the additional Tylenol .  You can however take the Percocet with the ibuprofen .  Gabapentin  is a controlled substance.  You can take 100 mg 3 times a day.  Try this for 24 hours before increasing your dose if needed.  If you do increase, go to 200 mg 3 times a day.  Again, please make sure you follow-up with your dentist for further titration and pain management of this.  I have included additional information into the discharge work for you to review.  If you have any fevers, trouble breathing, trouble swallowing, neck swelling, facial swelling, please return to your nearest emergency department for reevaluation.  If you have any other concerns, new or worsening symptoms, please return to your nearest Emergency Department for reevaluation.  **Try the orthodontic wax that we discussed as well!!  Contact a dental care provider if: You have dental pain and you don't know why. Your pain isn't controlled with medicines. Your symptoms get worse. You have new symptoms. Get help right away if: You can't open your mouth. You're having trouble breathing or swallowing. You have a fever. Your face, neck, or jaw is swollen. These symptoms may be an emergency. Call 911 right away. Do not wait to see if the symptoms will go away. Do not drive yourself to the hospital.

## 2024-02-16 ENCOUNTER — Other Ambulatory Visit: Payer: Self-pay

## 2024-02-16 ENCOUNTER — Encounter (HOSPITAL_COMMUNITY): Payer: Self-pay | Admitting: Emergency Medicine

## 2024-02-16 ENCOUNTER — Emergency Department (HOSPITAL_COMMUNITY)
Admission: EM | Admit: 2024-02-16 | Discharge: 2024-02-16 | Disposition: A | Discharge status: Home / Self Care | Payer: Self-pay | Attending: Emergency Medicine | Admitting: Emergency Medicine

## 2024-02-16 DIAGNOSIS — K0889 Other specified disorders of teeth and supporting structures: Secondary | ICD-10-CM

## 2024-02-16 DIAGNOSIS — K029 Dental caries, unspecified: Secondary | ICD-10-CM | POA: Insufficient documentation

## 2024-02-16 DIAGNOSIS — I10 Essential (primary) hypertension: Secondary | ICD-10-CM | POA: Insufficient documentation

## 2024-02-16 DIAGNOSIS — Z79899 Other long term (current) drug therapy: Secondary | ICD-10-CM | POA: Insufficient documentation

## 2024-02-16 MED ORDER — AMLODIPINE BESYLATE 5 MG PO TABS
5.0000 mg | ORAL_TABLET | Freq: Once | ORAL | Status: AC
  Administered 2024-02-16: 5 mg via ORAL
  Filled 2024-02-16: qty 1

## 2024-02-16 MED ORDER — AMOXICILLIN-POT CLAVULANATE 875-125 MG PO TABS: 1.0000 | ORAL_TABLET | Freq: Two times a day (BID) | ORAL | 0 refills | Status: AC

## 2024-02-16 MED ORDER — AMLODIPINE BESYLATE 5 MG PO TABS: 5.0000 mg | ORAL_TABLET | Freq: Every day | ORAL | 0 refills | Status: AC

## 2024-02-16 MED ORDER — KETOROLAC TROMETHAMINE 15 MG/ML IJ SOLN
15.0000 mg | Freq: Once | INTRAMUSCULAR | Status: AC
Start: 2024-02-16 — End: 2024-02-16
  Administered 2024-02-16: 15 mg via INTRAMUSCULAR
  Filled 2024-02-16: qty 1

## 2024-02-16 NOTE — ED Triage Notes (Signed)
 Pt reports dental pain and facial pain x 2 days.

## 2024-02-16 NOTE — Discharge Instructions (Addendum)
 A prescription for Augmentin , antibiotic was sent into the pharmacy for concern for dental abscess.  Please be sure to complete the full course of antibiotics.  Please call the number on the sheet to follow-up with a dentist.  You were additionally started on amlodipine  for your blood pressure.  Please call the number on the sheet to establish with a primary care doctor.  If you experience any new or worsening symptoms please return emergency room.

## 2024-02-16 NOTE — ED Provider Notes (Signed)
 [place] EMERGENCY DEPARTMENT AT [facility] Provider Note   CSN: [id number] Arrival date & time: 02/16/24  1228     Patient presents with: Dental Pain   Luke Bush is a 38 y.o. male who presents with dental and facial pain x 2 days.  Reports that he has had this in the past.  Pain is worse with chewing.  Denies any difficulty breathing or swallowing.  Additionally complains of persistent elevated blood pressure.  Reports that his blood pressure is consistently elevated.  He is asymptomatic without any headache, vision changes, chest pain or shortness of breath.  He is not on any blood pressure medications.    Dental Pain     Past Medical History:  Diagnosis Date   Hypertension    History reviewed. No pertinent surgical history.   Prior to Admission medications   Medication Sig Start Date End Date Taking? Authorizing Provider  amLODipine  (NORVASC ) 5 MG tablet Take 1 tablet (5 mg total) by mouth daily. 02/16/24   Donnajean Lynwood DEL, PA-C  amoxicillin -clavulanate (AUGMENTIN ) 875-125 MG tablet Take 1 tablet by mouth every 12 (twelve) hours. 02/16/24   Donnajean Lynwood DEL, PA-C  benzonatate  (TESSALON ) 100 MG capsule Take 1 capsule (100 mg total) by mouth every 8 (eight) hours. 01/27/20   Hall-Potvin, [place], PA-C  fluticasone  (FLONASE ) 50 MCG/ACT nasal spray Place 1 spray into both nostrils daily. 01/27/20   Hall-Potvin, Brittany, PA-C  gabapentin  (NEURONTIN ) 100 MG capsule Take 1 capsule (100 mg total) by mouth 3 (three) times daily. 01/05/24   [name], PA-C  ibuprofen  (ADVIL ) 600 MG tablet Take 1 tablet (600 mg total) by mouth every 6 (six) hours as needed. 01/05/24   [name], PA-C  levocetirizine (XYZAL ) 5 MG tablet Take 1 tablet (5 mg total) by mouth every evening. 10/18/20   Arloa Suzen RAMAN, NP  naproxen  (NAPROSYN ) 375 MG tablet Take 1 tablet (375 mg total) by mouth 2 (two) times daily with a meal. 01/12/22   Christopher Savannah, PA-C  oxyCODONE -acetaminophen   (PERCOCET/ROXICET) 5-325 MG tablet Take 1 tablet by mouth every 6 (six) hours as needed for severe pain (pain score 7-10). 01/02/24   Doretha Folks, MD  promethazine -dextromethorphan (PROMETHAZINE -DM) 6.25-15 MG/5ML syrup Take 5 mLs by mouth 4 (four) times daily as needed for cough. 10/18/20   Arloa Suzen RAMAN, NP    Allergies: Patient has no known allergies.    Review of Systems  HENT:  Positive for dental problem.     Updated Vital Signs BP (!) 178/114 (BP Location: Right Arm)   Pulse 78   Temp 98.5 F (36.9 C) (Oral)   Resp 16   SpO2 100%   Physical Exam Vitals and nursing note reviewed.  Constitutional:      General: He is not in acute distress.    Appearance: He is well-developed.  HENT:     Head: Normocephalic and atraumatic.     Mouth/Throat:     Comments: Overall poor dentition with multiple dental fractures and caries.  Uvula is midline, no abnormal phonation, trismus or pooling of secretions, no sublingual elevation or submandibular swelling, no periapical abscess appreciated Eyes:     Conjunctiva/sclera: Conjunctivae normal.  Cardiovascular:     Rate and Rhythm: Normal rate and regular rhythm.     Heart sounds: No murmur heard. Pulmonary:     Effort: Pulmonary effort is normal. No respiratory distress.     Breath sounds: Normal breath sounds.  Abdominal:     Palpations: Abdomen is  soft.     Tenderness: There is no abdominal tenderness.  Musculoskeletal:        General: No swelling.     Cervical back: Neck supple.  Skin:    General: Skin is warm and dry.     Capillary Refill: Capillary refill takes less than 2 seconds.  Neurological:     Mental Status: He is alert.  Psychiatric:        Mood and Affect: Mood normal.     (all labs ordered are listed, but only abnormal results are displayed) Labs Reviewed - No data to display  EKG: None  Radiology: No results found.   Procedures   Medications Ordered in the ED  ketorolac  (TORADOL ) 15 MG/ML  injection 15 mg (has no administration in time range)  amLODipine  (NORVASC ) tablet 5 mg (has no administration in time range)    Clinical Course as of 02/16/24 1328  Sat Feb 16, 2024  1319 Patient evaluated for complaints of dental pain x 2 days in addition to chronically uncontrolled blood pressure.  Patient is noted to be hypertensive upon presentation 178/114, otherwise hemodynamically stable.  He is asymptomatic however.  Chart review demonstrates numerous visits with similar blood pressure readings.  He is without PCP management.  Will provide and start him on a low-dose of amlodipine  here in the ED and provide PCP follow-up.  Regarding his dental pain we will provide a dose of Toradol  and discharged home on a course of Augmentin  and dentist follow-up.  Patient is understanding agreement plan. [JT]    Clinical Course User Index [JT] Donnajean Lynwood DEL, PA-C                                 Medical Decision Making Risk Prescription drug management.   This patient presents to the ED with chief complaint(s) of dental pain and hypertension.  The complaint involves an extensive differential diagnosis and also carries with it a high risk of complications and morbidity.   Pertinent past medical history as listed in HPI  The differential diagnosis includes  Exam and history not consistent with Ludwig's angina, retropharyngeal or peritonsillar abscess.  Do not suspect ACS, CVA, TIA. Additional history obtained: Records reviewed Care Everywhere/External Records  Disposition:   Patient will be discharged home. The patient has been appropriately medically screened and/or stabilized in the ED. I have low suspicion for any other emergent medical condition which would require further screening, evaluation or treatment in the ED or require inpatient management. At time of discharge the patient is hemodynamically stable and in no acute distress. I have discussed work-up results and diagnosis with  patient and answered all questions. Patient is agreeable with discharge plan. We discussed strict return precautions for returning to the emergency department and they verbalized understanding.     Social Determinants of Health:   Patient's impaired access to primary care  increases the complexity of managing their presentation  This note was dictated with voice recognition software.  Despite best efforts at proofreading, errors may have occurred which can change the documentation meaning.       Final diagnoses:  Pain, dental  Uncontrolled hypertension    ED Discharge Orders          Ordered    amoxicillin -clavulanate (AUGMENTIN ) 875-125 MG tablet  Every 12 hours        02/16/24 1327    amLODipine  (NORVASC ) 5 MG tablet  Daily  02/16/24 1327               Donnajean Lynwood DEL, PA-C 02/16/24 1329    Laurice Maude BROCKS, MD 02/16/24 1353
